# Patient Record
Sex: Male | Born: 1970 | Race: White | Hispanic: No | Marital: Married | Smoking: Never smoker
Health system: Southern US, Community
[De-identification: ages and names within clinical notes are randomized; demographics above are authoritative.]

## PROBLEM LIST (undated history)

## (undated) HISTORY — PX: VASECTOMY: SHX75

---

## 2013-08-13 ENCOUNTER — Emergency Department (INDEPENDENT_AMBULATORY_CARE_PROVIDER_SITE_OTHER)
Admission: EM | Admit: 2013-08-13 | Discharge: 2013-08-13 | Disposition: A | Discharge status: Home / Self Care | Payer: BC Managed Care – PPO | Source: Home / Self Care | Attending: Emergency Medicine | Admitting: Emergency Medicine

## 2013-08-13 ENCOUNTER — Encounter: Payer: Self-pay | Admitting: Emergency Medicine

## 2013-08-13 DIAGNOSIS — L237 Allergic contact dermatitis due to plants, except food: Secondary | ICD-10-CM

## 2013-08-13 DIAGNOSIS — L255 Unspecified contact dermatitis due to plants, except food: Secondary | ICD-10-CM

## 2013-08-13 MED ORDER — METHYLPREDNISOLONE SODIUM SUCC 125 MG IJ SOLR
125.0000 mg | INTRAMUSCULAR | Status: AC
  Administered 2013-08-13: 125 mg via INTRAMUSCULAR

## 2013-08-13 MED ORDER — BETAMETHASONE DIPROPIONATE 0.05 % EX CREA: TOPICAL_CREAM | Freq: Two times a day (BID) | CUTANEOUS | Status: AC

## 2013-08-13 MED ORDER — HYDROXYZINE HCL 10 MG PO TABS: 10.0000 mg | ORAL_TABLET | ORAL | Status: AC | PRN

## 2013-08-13 MED ORDER — PREDNISONE (PAK) 10 MG PO TABS: ORAL_TABLET | ORAL | Status: DC

## 2013-08-13 NOTE — ED Notes (Signed)
Poison ivy x 4 days on arms and legs, red, itchy, burns, hurts

## 2013-08-13 NOTE — ED Provider Notes (Signed)
CSN: [id number]     Arrival date & time 08/13/13  1126 History   First MD Initiated Contact with Patient 08/13/13 1128     Chief Complaint  Patient presents with  . Rash   (Consider location/radiation/quality/duration/timing/severity/associated sxs/prior Treatment) HPI Poison ivy x 4 days on arms and legs, red, itchy, burns, hurts. This progressively worsening despite OTC treatment.  History reviewed. No pertinent past medical history. History reviewed. No pertinent past surgical history. Family History  Problem Relation Age of Onset  . Diabetes Mother   . Cancer Mother   . Hypertension Mother    History  Substance Use Topics  . Smoking status: Never Smoker   . Smokeless tobacco: Not on file  . Alcohol Use: No    Review of Systems  All other systems reviewed and are negative.   Allergies  Review of patient's allergies indicates no known allergies.  Home Medications   Prior to Admission medications   Medication Sig Start Date End Date Taking? Authorizing Provider  diphenhydrAMINE (BENADRYL) 25 mg capsule Take 25 mg by mouth every 6 (six) hours as needed.   Yes Historical Provider, MD  Poison Ivy Treatments ([facility] IV[facility]) Apply topically.   Yes Historical Provider, MD  betamethasone dipropionate (DIPROLENE) 0.05 % cream Apply topically 2 (two) times daily. Do not use on face 08/13/13   Lajean Manesavid Massey, MD  hydrOXYzine (ATARAX/VISTARIL) 10 MG tablet Take 1 tablet (10 mg total) by mouth every 4 (four) hours as needed for itching. Caution: May cause drowsiness.May take 2 at bedtime. 08/13/13   Lajean Manesavid Massey, MD  predniSONE (STERAPRED UNI-PAK) 10 MG tablet Starting tomorrow( 7/1), Take 6 on day 1, 5 on day 2, 4 on day 3, then 3 on day 4, then 2  on day 5, then 1 on day 6. 08/13/13   Lajean Manesavid Massey, MD   BP 123/80  Pulse 57  Temp(Src) 98.1 F (36.7 C) (Oral)  Ht 5\' 10"  (1.778 m)  Wt 210 lb (95.255 kg)  BMI 30.13 kg/m2  SpO2 97% Physical Exam  Nursing note and vitals  reviewed. Constitutional: He is oriented to person, place, and time. He appears well-developed and well-nourished. No distress.  HENT:  Head: Normocephalic and atraumatic.  Eyes: Conjunctivae and EOM are normal. Pupils are equal, round, and reactive to light. No scleral icterus.  Neck: Normal range of motion.  Cardiovascular: Normal rate.   Pulmonary/Chest: Effort normal.  Abdominal: He exhibits no distension.  Musculoskeletal: Normal range of motion.  Neurological: He is alert and oriented to person, place, and time.  Skin: Skin is warm.  Severe poison ivy eruption, redness vesicles in clusters both upper extremities almost completely covering the forearms and hands. Few areas on neck and face. No lip swelling. Airway intact  Psychiatric: He has a normal mood and affect.    ED Course  Procedures (including critical care time) Labs Review Labs Reviewed - No data to display  Imaging Review No results found.   MDM   1. Poison ivy dermatitis    Severe. Treatment options discussed, as well as risks, benefits, alternatives. Patient voiced understanding and agreement with the following plans: Solu-Medrol 125 IM stat Prednisone 10 mg-6 day Dosepak Betamethasone dipropionate cream The weeping areas forearms cleansed and dressed. Home care discussed Follow-up with your primary care doctor in 5-7 days if not improving, or sooner if symptoms become worse. Precautions discussed. Red flags discussed. Questions invited and answered. Patient voiced understanding and agreement.     Lajean Manesavid Massey,  MD 08/13/13 1638

## 2014-01-24 ENCOUNTER — Encounter: Payer: Self-pay | Admitting: Emergency Medicine

## 2014-01-24 ENCOUNTER — Emergency Department (INDEPENDENT_AMBULATORY_CARE_PROVIDER_SITE_OTHER): Payer: BC Managed Care – PPO

## 2014-01-24 ENCOUNTER — Emergency Department (INDEPENDENT_AMBULATORY_CARE_PROVIDER_SITE_OTHER)
Admission: EM | Admit: 2014-01-24 | Discharge: 2014-01-24 | Disposition: A | Discharge status: Home / Self Care | Payer: BC Managed Care – PPO | Source: Home / Self Care | Attending: Family Medicine | Admitting: Family Medicine

## 2014-01-24 DIAGNOSIS — S6392XA Sprain of unspecified part of left wrist and hand, initial encounter: Secondary | ICD-10-CM

## 2014-01-24 DIAGNOSIS — T1490 Injury, unspecified: Secondary | ICD-10-CM

## 2014-01-24 DIAGNOSIS — T1490XA Injury, unspecified, initial encounter: Secondary | ICD-10-CM

## 2014-01-24 DIAGNOSIS — M79642 Pain in left hand: Secondary | ICD-10-CM

## 2014-01-24 NOTE — Discharge Instructions (Signed)
Wear wrist splint until further evaluation by sports medicine physician or orthopedist.  May continue ice pack once or twice daily.
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
## 2014-01-24 NOTE — ED Notes (Signed)
States hit left hand 2 weeks ago; pain is now more discomfort; edema has resolved.

## 2014-01-26 ENCOUNTER — Telehealth: Payer: Self-pay | Admitting: Emergency Medicine

## 2014-06-15 ENCOUNTER — Emergency Department (INDEPENDENT_AMBULATORY_CARE_PROVIDER_SITE_OTHER)
Admission: EM | Admit: 2014-06-15 | Discharge: 2014-06-15 | Disposition: A | Discharge status: Home / Self Care | Payer: BLUE CROSS/BLUE SHIELD | Source: Home / Self Care | Attending: Emergency Medicine | Admitting: Emergency Medicine

## 2014-06-15 ENCOUNTER — Encounter: Payer: Self-pay | Admitting: Emergency Medicine

## 2014-06-15 DIAGNOSIS — L237 Allergic contact dermatitis due to plants, except food: Secondary | ICD-10-CM | POA: Diagnosis not present

## 2014-06-15 MED ORDER — METHYLPREDNISOLONE SODIUM SUCC 125 MG IJ SOLR
125.0000 mg | Freq: Once | INTRAMUSCULAR | Status: AC
  Administered 2014-06-15: 125 mg via INTRAMUSCULAR

## 2014-06-15 MED ORDER — PREDNISONE 10 MG PO TABS: ORAL_TABLET | ORAL | Status: AC

## 2014-06-15 NOTE — ED Notes (Signed)
States must have gotten yard debris from weed-trimming yesterday into right eye; no redness or edema last night, but awoke today with drainage, edema and conjunctivitis. Worried that poison ivy might be involved since noticed rash on right arm.

## 2014-06-15 NOTE — Discharge Instructions (Signed)

## 2014-06-15 NOTE — ED Provider Notes (Signed)
CSN: [id number]     Arrival date & time 06/15/14  1449 History   First MD Initiated Contact with Patient 06/15/14 1532     Chief Complaint  Patient presents with  . Eye Injury   (Consider location/radiation/quality/duration/timing/severity/associated sxs/prior Treatment) Patient is a 44 y.o. male presenting with eye injury. The history is provided by the patient. No language interpreter was used.  Eye Injury This is a new problem. The current episode started 12 to 24 hours ago. The problem occurs constantly. The problem has been gradually worsening. Nothing aggravates the symptoms. Nothing relieves the symptoms. He has tried nothing for the symptoms.  Pt reports he was weed eating yesterday and had something go into his eye.  Pt reports today he has poison ivy of wrist and arm.   Pt thinks he may have had poison ivy exposure to his eye.   History reviewed. No pertinent past medical history. Past Surgical History  Procedure Laterality Date  . Vasectomy     Family History  Problem Relation Age of Onset  . Diabetes Mother   . Cancer Mother   . Hypertension Mother    History  Substance Use Topics  . Smoking status: Never Smoker   . Smokeless tobacco: Not on file  . Alcohol Use: No    Review of Systems  Skin: Positive for rash.  All other systems reviewed and are negative.   Allergies  Review of patient's allergies indicates no known allergies.  Home Medications   Prior to Admission medications   Medication Sig Start Date End Date Taking? Authorizing Provider  betamethasone dipropionate (DIPROLENE) 0.05 % cream Apply topically 2 (two) times daily. Do not use on face 08/13/13   Lajean Manesavid Massey, MD  diphenhydrAMINE (BENADRYL) 25 mg capsule Take 25 mg by mouth every 6 (six) hours as needed.    Historical Provider, MD  hydrOXYzine (ATARAX/VISTARIL) 10 MG tablet Take 1 tablet (10 mg total) by mouth every 4 (four) hours as needed for itching. Caution: May cause drowsiness.May take 2 at  bedtime. 08/13/13   Lajean Manesavid Massey, MD  Poison Ivy Treatments ([facility]) Apply topically.    Historical Provider, MD  predniSONE (DELTASONE) 10 MG tablet 6,6,5,5,4,4,3,3,2,2,1,1  Taper dosage 06/15/14   Lonia SkinnerLeslie K Deondray Ospina, PA-C   BP 109/74 mmHg  Pulse 71  Temp(Src) 98 F (36.7 C) (Oral)  Resp 16  SpO2 98% Physical Exam  Constitutional: He appears well-developed and well-nourished.  HENT:  Head: Normocephalic.  Eyes: Pupils are equal, round, and reactive to light.  Injected right conjunctiva, swollen bilat eyelids,  No blistering to eye  fluroscein  No uptake,   No foreign body,  I swabbed under eyelids and everted,   Neck: Normal range of motion.  Cardiovascular: Normal rate.   Pulmonary/Chest: Effort normal.  Nursing note and vitals reviewed.   ED Course  Procedures (including critical care time) Labs Review Labs Reviewed - No data to display  Imaging Review No results found.   MDM   1. Poison ivy    Solumedrol Im  Prednisone 10 day taper Pt referred to Dr. Vonna KotykBevis for recheck if not improved/resolved in 36 hours    Lonia SkinnerLeslie K OverbrookSofia, PA-C 06/15/14 1620  Lonia SkinnerLeslie K KempnerSofia, [place] JerseyPA-C 06/17/14 1016

## 2014-06-23 ENCOUNTER — Encounter: Payer: Self-pay | Admitting: Emergency Medicine

## 2014-06-23 ENCOUNTER — Emergency Department (INDEPENDENT_AMBULATORY_CARE_PROVIDER_SITE_OTHER)
Admission: EM | Admit: 2014-06-23 | Discharge: 2014-06-23 | Disposition: A | Discharge status: Home / Self Care | Payer: BLUE CROSS/BLUE SHIELD | Source: Home / Self Care | Attending: Family Medicine | Admitting: Family Medicine

## 2014-06-23 DIAGNOSIS — J4531 Mild persistent asthma with (acute) exacerbation: Secondary | ICD-10-CM

## 2014-06-23 DIAGNOSIS — B9789 Other viral agents as the cause of diseases classified elsewhere: Principal | ICD-10-CM

## 2014-06-23 DIAGNOSIS — J069 Acute upper respiratory infection, unspecified: Secondary | ICD-10-CM | POA: Diagnosis not present

## 2014-06-23 DIAGNOSIS — J302 Other seasonal allergic rhinitis: Secondary | ICD-10-CM

## 2014-06-23 MED ORDER — FLUTICASONE PROPIONATE 50 MCG/ACT NA SUSP: NASAL | Status: AC

## 2014-06-23 MED ORDER — AZITHROMYCIN 250 MG PO TABS: ORAL_TABLET | ORAL | Status: AC

## 2014-06-23 MED ORDER — FLUTICASONE-SALMETEROL 100-50 MCG/DOSE IN AEPB: 1.0000 | INHALATION_SPRAY | Freq: Two times a day (BID) | RESPIRATORY_TRACT | Status: AC

## 2014-06-23 NOTE — ED Notes (Signed)
Reports congestion and fatigue for past few days; is completing course of prednisone for conjunctivitis due to contact dermatitis 06/15/14.

## 2014-06-23 NOTE — ED Provider Notes (Signed)
CSN: [id number]     Arrival date & time 06/23/14  0911 History   First MD Initiated Contact with Patient 06/23/14 1010     Chief Complaint  Patient presents with  . Nasal Congestion  . Fatigue      HPI Comments: Patient was evaluated 8 days ago for conjunctivitis and contact dermatitis and is finishing a course of prednisone (2 days left).  He now complains of four day history of fatigue.  Two days ago while running he felt tight in his chest and used his albuterol inhaler (he has a history of exercise asthma and seasonal allergies).  He has now developed cough, mild sore throat, headache, and myalgias.  The history is provided by the patient.    History reviewed. No pertinent past medical history. Past Surgical History  Procedure Laterality Date  . Vasectomy     Family History  Problem Relation Age of Onset  . Diabetes Mother   . Cancer Mother   . Hypertension Mother    History  Substance Use Topics  . Smoking status: Never Smoker   . Smokeless tobacco: Not on file  . Alcohol Use: No    Review of Systems + mild sore throat + cough No pleuritic pain ? wheezing + nasal congestion + post-nasal drainage No sinus pain/pressure No itchy/red eyes No earache No hemoptysis + SOB No fever, ? chills No nausea No vomiting No abdominal pain No diarrhea No urinary symptoms No skin rash + fatigue + myalgias + headache Used OTC meds without relief  Allergies  Review of patient's allergies indicates no known allergies.  Home Medications   Prior to Admission medications   Medication Sig Start Date End Date Taking? Authorizing Provider  azithromycin (ZITHROMAX Z-PAK) 250 MG tablet Take 2 tabs today; then begin one tab once daily for 4 more days. (Rx void after 07/01/14) 06/23/14   Lattie HawStephen A Beese, MD  betamethasone dipropionate (DIPROLENE) 0.05 % cream Apply topically 2 (two) times daily. Do not use on face 08/13/13   Lajean Manesavid Massey, MD  diphenhydrAMINE (BENADRYL) 25 mg capsule  Take 25 mg by mouth every 6 (six) hours as needed.    Historical Provider, MD  fluticasone Aleda Grana(FLONASE) 50 MCG/ACT nasal spray Place two sprays in each nostril once daily 06/23/14   Lattie HawStephen A Beese, MD  Fluticasone-Salmeterol (ADVAIR DISKUS) 100-50 MCG/DOSE AEPB Inhale 1 puff into the lungs 2 (two) times daily. 06/23/14   Lattie HawStephen A Beese, MD  hydrOXYzine (ATARAX/VISTARIL) 10 MG tablet Take 1 tablet (10 mg total) by mouth every 4 (four) hours as needed for itching. Caution: May cause drowsiness.May take 2 at bedtime. 08/13/13   Lajean Manesavid Massey, MD  Poison Ivy Treatments ([facility]) Apply topically.    Historical Provider, MD  predniSONE (DELTASONE) 10 MG tablet 6,6,5,5,4,4,3,3,2,2,1,1  Taper dosage 06/15/14   Lonia SkinnerLeslie K Sofia, PA-C   BP 130/87 mmHg  Pulse 67  Temp(Src) 97.6 F (36.4 C) (Oral)  Resp 16  Ht 5\' 10"  (1.778 m)  Wt 210 lb (95.255 kg)  BMI 30.13 kg/m2  SpO2 99% Physical Exam Nursing notes and Vital Signs reviewed. Appearance:  Patient appears stated age, and in no acute distress Eyes:  Pupils are equal, round, and reactive to light and accomodation.  Extraocular movement is intact.  Conjunctivae are not inflamed  Ears:  Canals normal.  Tympanic membranes normal.  Nose: Congested turbinates.  No sinus tenderness.   Pharynx:  Normal Neck:  Supple.  Tender enlarged posterior nodes are palpated  bilaterally  Lungs:   Tubular breath sounds posteriorly.  Breath sounds are equal.  Heart:  Regular rate and rhythm without murmurs, rubs, or gallops.  Abdomen:  Nontender without masses or hepatosplenomegaly.  Bowel sounds are present.  No CVA or flank tenderness.  Extremities:  No edema.  No calf tenderness Skin:  No rash present.   ED Course  Procedures  none   MDM   1. Viral URI with cough   2. Asthma with exacerbation, mild persistent   3. Seasonal allergic rhinitis    There is no evidence of bacterial infection today.  Begin trial of Advair and Flonase nasal spray. Take plain  guaifenesin (1200mg  extended release tabs such as Mucinex) twice daily, with plenty of water, for cough and congestion.  May add Pseudoephedrine (30mg , one or two every 4 to 6 hours) for sinus congestion.  Get adequate rest.   May use Afrin nasal spray (or generic oxymetazoline) twice daily for about 5 days.  Also recommend using saline nasal spray several times daily and saline nasal irrigation (AYR is a common brand).  Use Flonase nasal spray each morning after using Afrin nasal spray and saline nasal irrigation. Try warm salt water gargles for sore throat.  May take Delsym Cough Suppressant at bedtime for nighttime cough.  Finish prednisone. Use albuterol inhaler as needed. Stop all antihistamines for now, and other non-prescription cough/cold preparations. Begin Azithromycin if not improving about one week or if persistent fever develops (Given a prescription to hold, with an expiration date)  Follow-up with family doctor if not improving about10 days.     Lattie HawStephen A Beese, MD 07/05/14 [phone]

## 2014-06-23 NOTE — Discharge Instructions (Signed)
Take plain guaifenesin (1200mg  extended release tabs such as Mucinex) twice daily, with plenty of water, for cough and congestion.  May add Pseudoephedrine (30mg , one or two every 4 to 6 hours) for sinus congestion.  Get adequate rest.   May use Afrin nasal spray (or generic oxymetazoline) twice daily for about 5 days.  Also recommend using saline nasal spray several times daily and saline nasal irrigation (AYR is a common brand).  Use Flonase nasal spray each morning after using Afrin nasal spray and saline nasal irrigation. Try warm salt water gargles for sore throat.  May take Delsym Cough Suppressant at bedtime for nighttime cough.  Finish prednisone. Use albuterol inhaler as needed. Stop all antihistamines for now, and other non-prescription cough/cold preparations. Begin Azithromycin if not improving about one week or if persistent fever develops  Follow-up with family doctor if not improving about10 days.

## 2016-05-24 IMAGING — CR DG HAND COMPLETE 3+V*L*
3 series · 3 of 3 positions shown · non-contrast
Comparison: None.

CLINICAL DATA: Struck the dorsal left hand against an unspecified
firm object approximately 3 weeks ago. Persistent pain and swelling
across the metacarpals. Initial encounter.

EXAM:
LEFT HAND - COMPLETE 3+ VIEW

[view not recorded (1 of 3)]
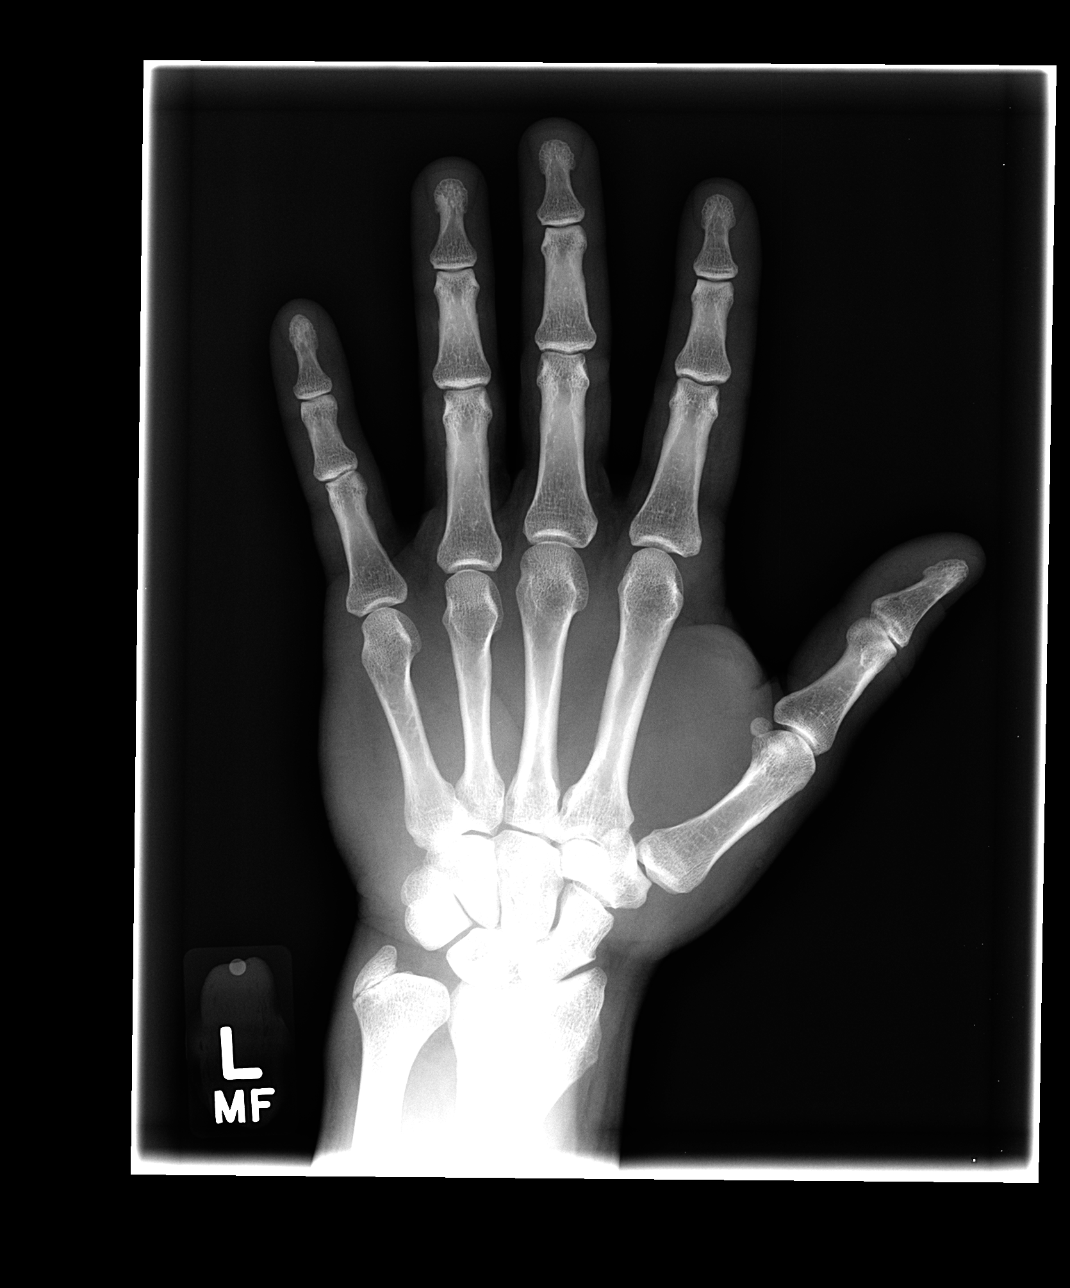

[view not recorded (2 of 3)]
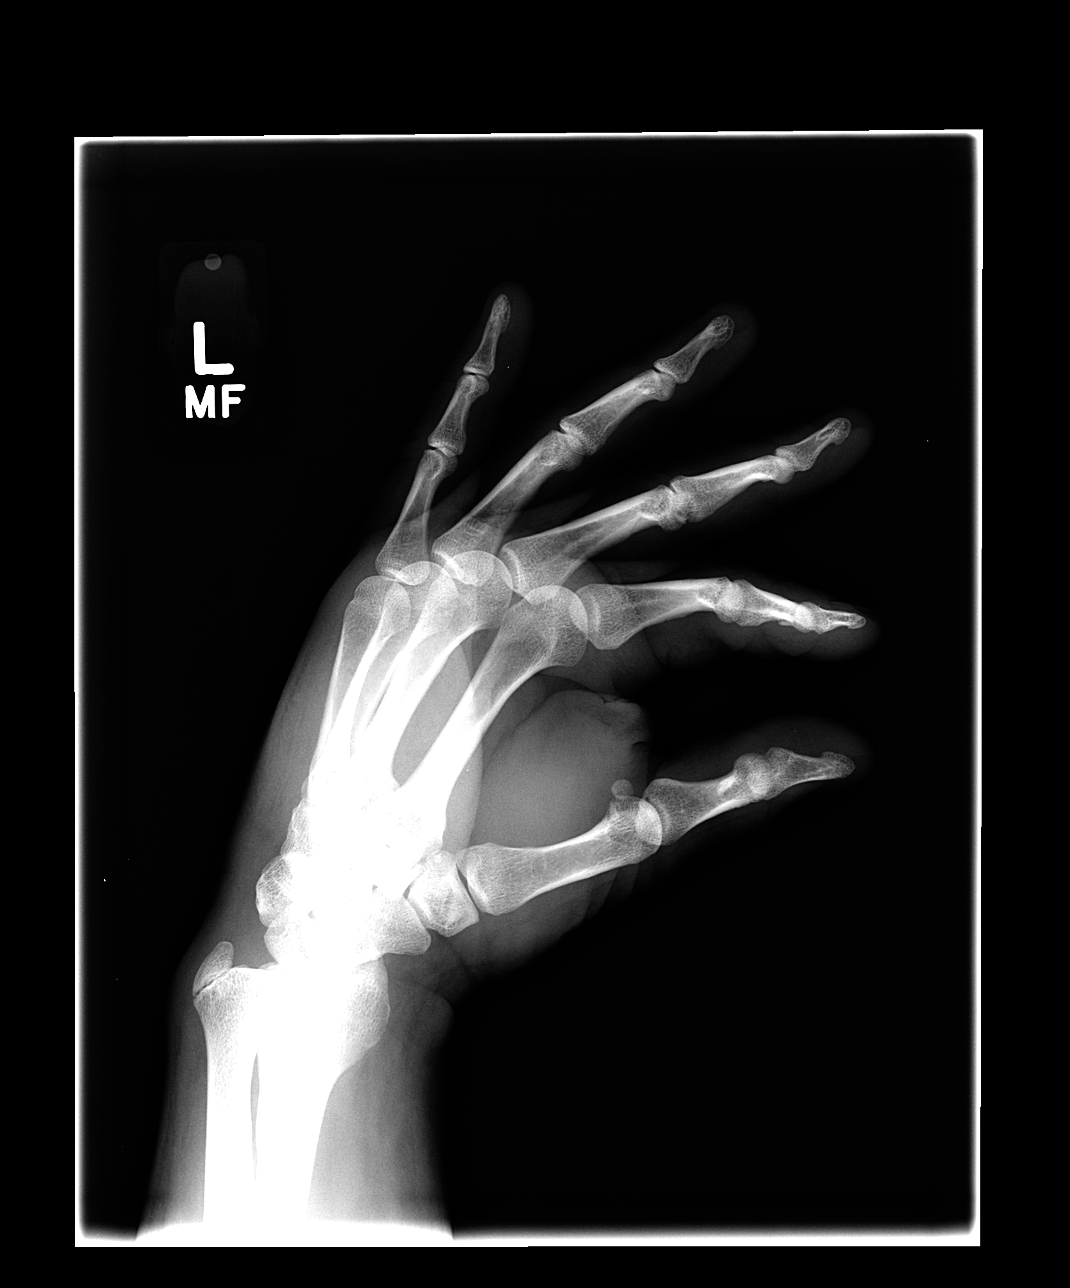

[view not recorded (3 of 3)]
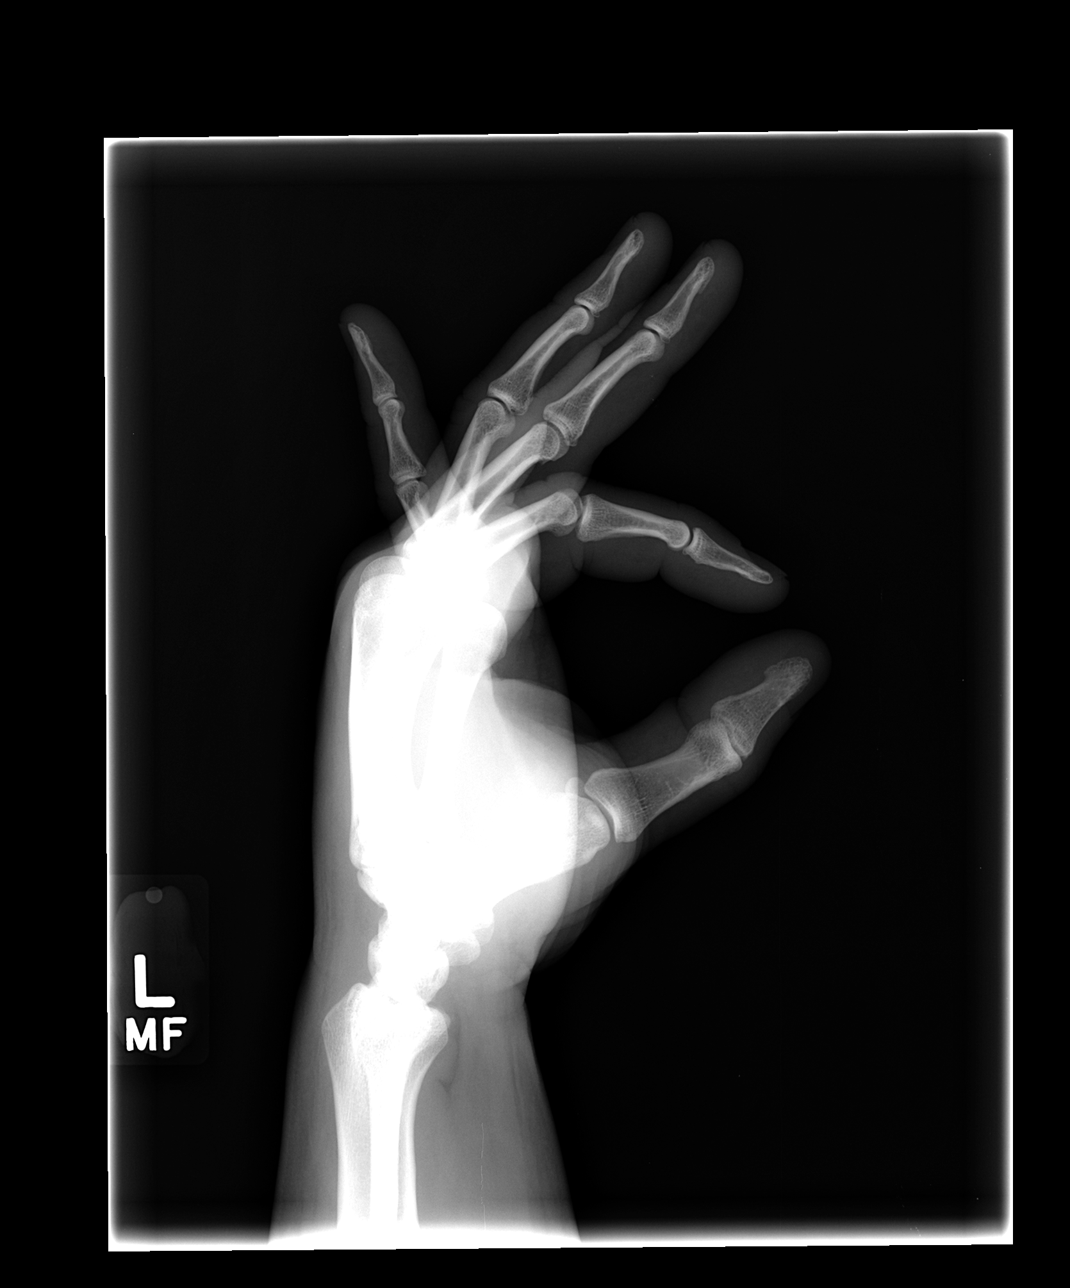

[3 of 3 positions shown; findings below may reference images not displayed]

FINDINGS: Dorsal soft tissue swelling. No evidence of acute or subacute
fracture or dislocation. Ulnar styloid is present as a an accessory
ossicle. Well preserved joint spaces. Well preserved bone mineral
density.
IMPRESSION: No acute, subacute or significant osseous abnormality.
